# Patient Record
Sex: Male | Born: 1990 | Race: White | Hispanic: No | Marital: Single | Smoking: Never smoker
Health system: Southern US, Community
[De-identification: ages and names within clinical notes are randomized; demographics above are authoritative.]

## PROBLEM LIST (undated history)

## (undated) HISTORY — PX: SHOULDER SURGERY: SHX246

---

## 2015-09-09 ENCOUNTER — Encounter (HOSPITAL_COMMUNITY): Payer: Self-pay

## 2015-09-09 ENCOUNTER — Emergency Department (HOSPITAL_COMMUNITY)
Admission: EM | Admit: 2015-09-09 | Discharge: 2015-09-09 | Disposition: A | Discharge status: Home / Self Care | Payer: Worker's Compensation | Attending: Emergency Medicine | Admitting: Emergency Medicine

## 2015-09-09 ENCOUNTER — Emergency Department (HOSPITAL_COMMUNITY): Payer: Worker's Compensation

## 2015-09-09 DIAGNOSIS — S4992XA Unspecified injury of left shoulder and upper arm, initial encounter: Secondary | ICD-10-CM | POA: Diagnosis present

## 2015-09-09 DIAGNOSIS — S43015A Anterior dislocation of left humerus, initial encounter: Secondary | ICD-10-CM | POA: Diagnosis not present

## 2015-09-09 DIAGNOSIS — Y990 Civilian activity done for income or pay: Secondary | ICD-10-CM | POA: Insufficient documentation

## 2015-09-09 DIAGNOSIS — S43005A Unspecified dislocation of left shoulder joint, initial encounter: Secondary | ICD-10-CM

## 2015-09-09 DIAGNOSIS — Y9289 Other specified places as the place of occurrence of the external cause: Secondary | ICD-10-CM | POA: Insufficient documentation

## 2015-09-09 DIAGNOSIS — Y9389 Activity, other specified: Secondary | ICD-10-CM | POA: Diagnosis not present

## 2015-09-09 DIAGNOSIS — W010XXA Fall on same level from slipping, tripping and stumbling without subsequent striking against object, initial encounter: Secondary | ICD-10-CM | POA: Diagnosis not present

## 2015-09-09 MED ORDER — PROPOFOL 10 MG/ML IV BOLUS
INTRAVENOUS | Status: AC | PRN
  Administered 2015-09-09: 40 mg via INTRAVENOUS

## 2015-09-09 MED ORDER — PROPOFOL 10 MG/ML IV BOLUS: 0.5000 mg/kg | Freq: Once | INTRAVENOUS | Status: DC

## 2015-09-09 MED ORDER — HYDROMORPHONE HCL 1 MG/ML IJ SOLN
0.5000 mg | Freq: Once | INTRAMUSCULAR | Status: AC
  Administered 2015-09-09: 0.5 mg via INTRAVENOUS
  Filled 2015-09-09: qty 1

## 2015-09-09 MED ORDER — DIAZEPAM 5 MG/ML IJ SOLN
5.0000 mg | Freq: Once | INTRAMUSCULAR | Status: AC
  Administered 2015-09-09: 5 mg via INTRAVENOUS
  Filled 2015-09-09: qty 2

## 2015-09-09 MED ORDER — PROPOFOL 10 MG/ML IV BOLUS
0.5000 mg/kg | Freq: Once | INTRAVENOUS | Status: AC
  Administered 2015-09-09: 170 mg via INTRAVENOUS
  Filled 2015-09-09: qty 1

## 2015-09-09 NOTE — ED Notes (Signed)
Per EMS, Pt, from job site, c/o L shoulder injury/dislocation after falling on a roof and trying to catch himself w/ L arm only.  Pain score 10/10.  Deformity noted.  Strong pulses noted.  Fentanyl given en route.

## 2015-09-09 NOTE — ED Notes (Signed)
Bed: ZO10 Expected date:  Expected time:  Means of arrival:  Comments: EMS- shoulder dislocation

## 2015-09-09 NOTE — Discharge Instructions (Signed)
Shoulder Dislocation You dislocated your shoulder today and it has been put back in place.  Do not do any strenuous exercise, heavy lifting until cleared by an orthopedic doctor.  Keep your arm in the arm sling provided.  A shoulder dislocation happens when the upper arm bone (humerus) moves out of the shoulder joint. The shoulder joint is the part of the shoulder where the humerus, shoulder blade (scapula), and collarbone (clavicle) meet. CAUSES This condition is often caused by:  A fall.  A hit to the shoulder.  A forceful movement of the shoulder. RISK FACTORS This condition is more likely to develop in people who play sports. SYMPTOMS Symptoms of this condition include:  Deformity of the shoulder.  Intense pain.  Inability to move the shoulder.  Numbness, weakness, or tingling in your neck or down your arm.  Bruising or swelling around your shoulder. DIAGNOSIS This condition is diagnosed with a physical exam. After the exam, tests may be done to check for related problems. Tests that may be done include:  X-ray. This may be done to check for broken bones.  MRI. This may be done to check for damage to the tissues around the shoulder.  Electromyogram. This may be done to check for nerve damage. TREATMENT This condition is treated with a procedure to place the humerus back in the joint. This procedure is called a reduction. There are two types of reduction:  Closed reduction. In this procedure, the humerus is placed back in the joint without surgery. The health care provider uses his or her hands to guide the bone back into place.  Open reduction. In this procedure, the humerus is placed back in the joint with surgery. An open reduction may be recommended if:  You have a weak shoulder joint or weak ligaments.  You have had more than one shoulder dislocation.  The nerves or blood vessels around your shoulder have been damaged. After the humerus is placed back into the  joint, your arm will be placed in a splint or sling to prevent it from moving. You will need to wear the splint or sling until your shoulder heals. When the splint or sling is removed, you may have physical therapy to help improve the range of motion in your shoulder joint. HOME CARE INSTRUCTIONS If You Have a Splint or Sling:  Wear it as told by your health care provider. Remove it only as told by your health care provider.  Loosen it if your fingers become numb and tingle, or if they turn cold and blue.  Keep it clean and dry. Bathing  Do not take baths, swim, or use a hot tub until your health care provider approves. Ask your health care provider if you can take showers. You may only be allowed to take sponge baths for bathing.  If your health care provider approves bathing and showering, cover your splint or sling with a watertight plastic bag to protect it from water. Do not let the splint or sling get wet. Managing Pain, Stiffness, and Swelling  If directed, apply ice to the injured area.  Put ice in a plastic bag.  Place a towel between your skin and the bag.  Leave the ice on for 20 minutes, 2-3 times per day.  Move your fingers often to avoid stiffness and to decrease swelling.  Raise (elevate) the injured area above the level of your heart while you are sitting or lying down. Driving  Do not drive while wearing a splint  or sling on a hand that you use for driving.  Do not drive or operate heavy machinery while taking pain medicine. Activity  Return to your normal activities as told by your health care provider. Ask your health care provider what activities are safe for you.  Perform range-of-motion exercises only as told by your health care provider.  Exercise your hand by squeezing a soft ball. This helps to decrease stiffness and swelling in your hand and wrist. General Instructions  Take over-the-counter and prescription medicines only as told by your health care  provider.  Do not use any tobacco products, including cigarettes, chewing tobacco, or e-cigarettes. Tobacco can delay bone and tissue healing. If you need help quitting, ask your health care provider.  Keep all follow-up visits as told by your health care provider. This is important. SEEK MEDICAL CARE IF:  Your splint or sling gets damaged. SEEK IMMEDIATE MEDICAL CARE IF:  Your pain gets worse rather than better.  You lose feeling in your arm or hand.  Your arm or hand becomes white and cold.   This information is not intended to replace advice given to you by your health care provider. Make sure you discuss any questions you have with your health care provider.   Document Released: 08/15/2001 Document Revised: 08/11/2015 Document Reviewed: 03/15/2015 Elsevier Interactive Patient Education 2016 [employer].   How to Use a Shoulder Immobilizer A shoulder immobilizer is a device that you may have to wear after a shoulder injury or surgery. This device keeps your arm from moving. This prevents additional pain or injury. It also supports your arm next to your body as your shoulder heals. You may need to wear a shoulder immobilizer to treat a broken bone (fracture) in your shoulder. You may also need to wear one if you have an injury that moves your shoulder out of position (dislocation). There are different types of shoulder immobilizers. The one that you get depends on your injury. RISKS AND COMPLICATIONS Wearing a shoulder immobilizer in the wrong way can let your injured shoulder move around too much. This may delay healing and make your pain and swelling worse. HOW TO USE YOUR SHOULDER IMMOBILIZER  The part of the immobilizer that goes around your neck (sling) should support your upper arm, with your elbow bent and your lower arm and hand across your chest.  Make sure that your elbow:  Is snug against the back pocket of the sling.  Does not move away from your body.  The strap  of the immobilizer should go over your shoulder and support your arm and hand. Your hand should be slightly higher than your elbow. It should not hang loosely over the edge of the sling.  If the long strap has a pad, place it where it is most comfortable on your neck.  Carefully follow your health care provider's instructions for wearing your shoulder immobilizer. Your health care provider may want you to:  Loosen your immobilizer to straighten your elbow and move your wrist and fingers. You may have to do this several times each day. Ask your health care provider when you should do this and how often.  Remove your immobilizer once every day to shower, but limit the movement in your injured arm. Before putting the immobilizer back on, use a towel to dry the area under your arm completely.  Remove your immobilizer to do shoulder exercises at home as directed by your health care provider.  Wear your immobilizer while you  sleep. You may sleep more comfortably if you have your upper body raised on pillows. SEEK MEDICAL CARE IF:  Your immobilizer is not supporting your arm properly.  Your immobilizer gets damaged.  You have worsening pain or swelling in your shoulder, arm, or hand.  Your shoulder, arm, or hand changes color or temperature.  You lose feeling in your shoulder, arm, or hand.   This information is not intended to replace advice given to you by your health care provider. Make sure you discuss any questions you have with your health care provider.   Document Released: 12/28/2004 Document Revised: 04/06/2015 Document Reviewed: 10/28/2014 Elsevier Interactive Patient Education [employer]

## 2015-09-09 NOTE — ED Provider Notes (Signed)
CSN: [id number]     Arrival date & time 09/09/15  1057 History   First MD Initiated Contact with Patient 09/09/15 1104     Chief Complaint  Patient presents with  . Shoulder Injury     (Consider location/radiation/quality/duration/timing/severity/associated sxs/prior Treatment) HPI Comments: 24 y.o. Male with no significant past medical history presents for left shoulder injury.  The patient works in roofing and the patient was on a roof when he tried to run up part of the roof to the next part and started slipping and grabbed hold of part of the roof with his left hand and felt his left shoulder come out of place.  He denies previous injury to this shoulder.  He did not fall.  No other injury.  Patient is a 24 y.o. male presenting with shoulder injury.  Shoulder Injury Pertinent negatives include no chest pain, no abdominal pain, no headaches and no shortness of breath.    No past medical history on file. No past surgical history on file. No family history on file. Social History  Substance Use Topics  . Smoking status: Not on file  . Smokeless tobacco: Not on file  . Alcohol Use: Not on file    Review of Systems  Constitutional: Negative for fever, chills and fatigue.  HENT: Negative for congestion, postnasal drip and rhinorrhea.   Respiratory: Negative for cough, chest tightness and shortness of breath.   Cardiovascular: Negative for chest pain, palpitations and leg swelling.  Gastrointestinal: Negative for nausea, vomiting and abdominal pain.  Genitourinary: Negative for dysuria and flank pain.  Musculoskeletal: Positive for arthralgias (left shoulder pain). Negative for myalgias, joint swelling and neck pain.  Neurological: Negative for syncope, weakness, numbness and headaches.  Hematological: Does not bruise/bleed easily.      Allergies  Review of patient's allergies indicates not on file.  Home Medications   Prior to Admission medications   Not on File   BP  150/101 mmHg  Pulse 91  Temp(Src) 98 F (36.7 C) (Oral)  Resp 20  SpO2 99% Physical Exam  Constitutional: He is oriented to person, place, and time. He appears well-developed and well-nourished. No distress.  HENT:  Head: Normocephalic and atraumatic.  Right Ear: External ear normal.  Left Ear: External ear normal.  Mouth/Throat: Oropharynx is clear and moist. No oropharyngeal exudate.  Eyes: EOM are normal. Pupils are equal, round, and reactive to light.  Neck: Normal range of motion. Neck supple.  Cardiovascular: Normal rate, regular rhythm, normal heart sounds and intact distal pulses.   No murmur heard. Pulmonary/Chest: Effort normal. No respiratory distress. He has no wheezes. He has no rales.  Abdominal: Soft. He exhibits no distension. There is no tenderness.  Musculoskeletal: He exhibits no edema.       Right shoulder: Normal.       Left shoulder: He exhibits decreased range of motion, tenderness, deformity, pain and spasm. He exhibits no laceration, normal pulse and normal strength.       Left elbow: Normal.       Left hand: Normal. Normal sensation noted. Normal strength noted.  Neurological: He is alert and oriented to person, place, and time.  Skin: Skin is warm and dry. No rash noted. He is not diaphoretic.  Vitals reviewed.   ED Course  Reduction of dislocation Date/Time: 09/09/2015 12:59 PM Performed by: Tyrone Apple ROE Authorized by: Leta Baptist Consent: Verbal consent obtained. Written consent not obtained. Risks and benefits: risks, benefits and alternatives were discussed Consent  given by: patient Patient understanding: patient states understanding of the procedure being performed Patient consent: the patient's understanding of the procedure matches consent given Imaging studies: imaging studies available Required items: required blood products, implants, devices, and special equipment available Patient identity confirmed: verbally with patient and  arm band Local anesthesia used: no Patient sedated: yes Sedation type: moderate (conscious) sedation Sedatives: propofol Vitals: Vital signs were monitored during sedation. Patient tolerance: Patient tolerated the procedure well with no immediate complications Comments: Left shoulder reduced at bedside without complication  Procedural sedation Date/Time: 09/09/2015 1:00 PM Performed by: Tyrone Apple ROE Authorized by: Leta Baptist Consent: Verbal consent obtained. Risks and benefits: risks, benefits and alternatives were discussed Consent given by: patient Patient understanding: patient states understanding of the procedure being performed Patient consent: the patient's understanding of the procedure matches consent given Imaging studies: imaging studies available Required items: required blood products, implants, devices, and special equipment available Patient identity confirmed: verbally with patient and arm band Local anesthesia used: no Patient sedated: yes Sedatives: propofol Vitals: Vital signs were monitored during sedation. Patient tolerance: Patient tolerated the procedure well with no immediate complications   (including critical care time) Labs Review Labs Reviewed - No data to display  Imaging Review No results found. I have personally reviewed and evaluated these images and lab results as part of my medical decision-making.   EKG Interpretation None      MDM  Patient seen and evaluated in stable condition.  Neurovascularly intact.  Xray confirmed left shoulder dislocation without fracture.  Patient sedated and left shoulder reduced without complication.  Patient placed in shoulder immobilizer.  Patient instructed to follow up with orthopedics, refrain from strenuous physical activity/heavy lifting, and to keep immobilizer in place.  Patient discharge in stable condition with all questions answered prior to discharge. Final diagnoses:  None    1. Left  shoulder dislocation    Leta Baptist, MD 09/10/15 2143

## 2017-05-12 IMAGING — CR DG SHOULDER 2+V*L*
3 series · 3 of 3 positions shown · non-contrast
Comparison: None.

CLINICAL DATA: Injured shoulder at work today.

EXAM:
LEFT SHOULDER - 2+ VIEW

[x shoulder ap left (1 of 3)]
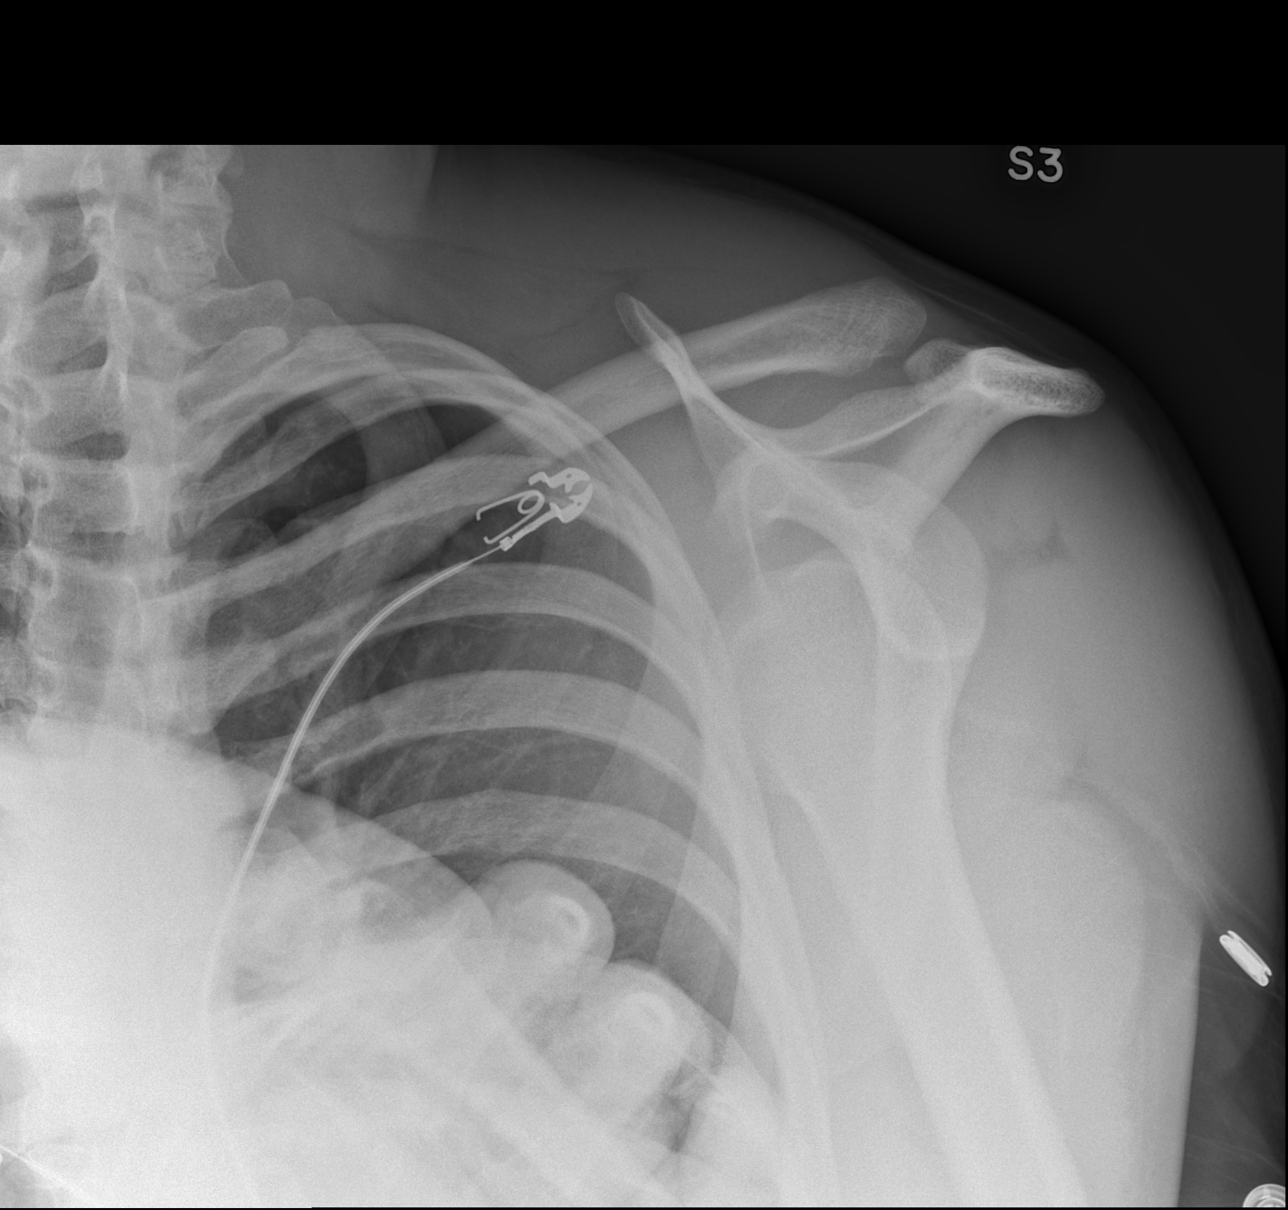

[x shoulder ap left (2 of 3)]
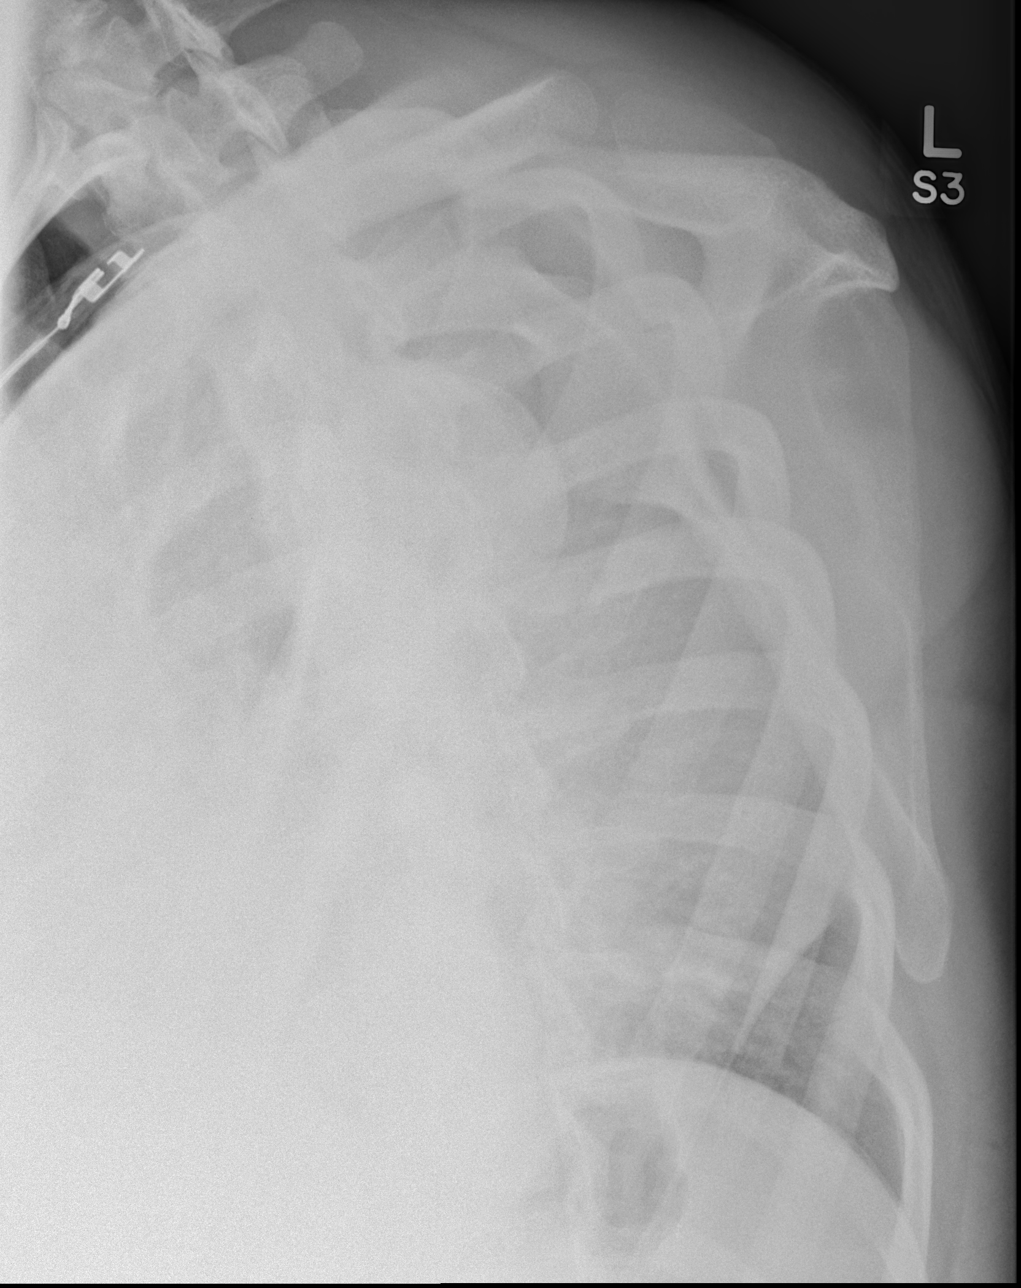

[x shoulder ap left (3 of 3)]
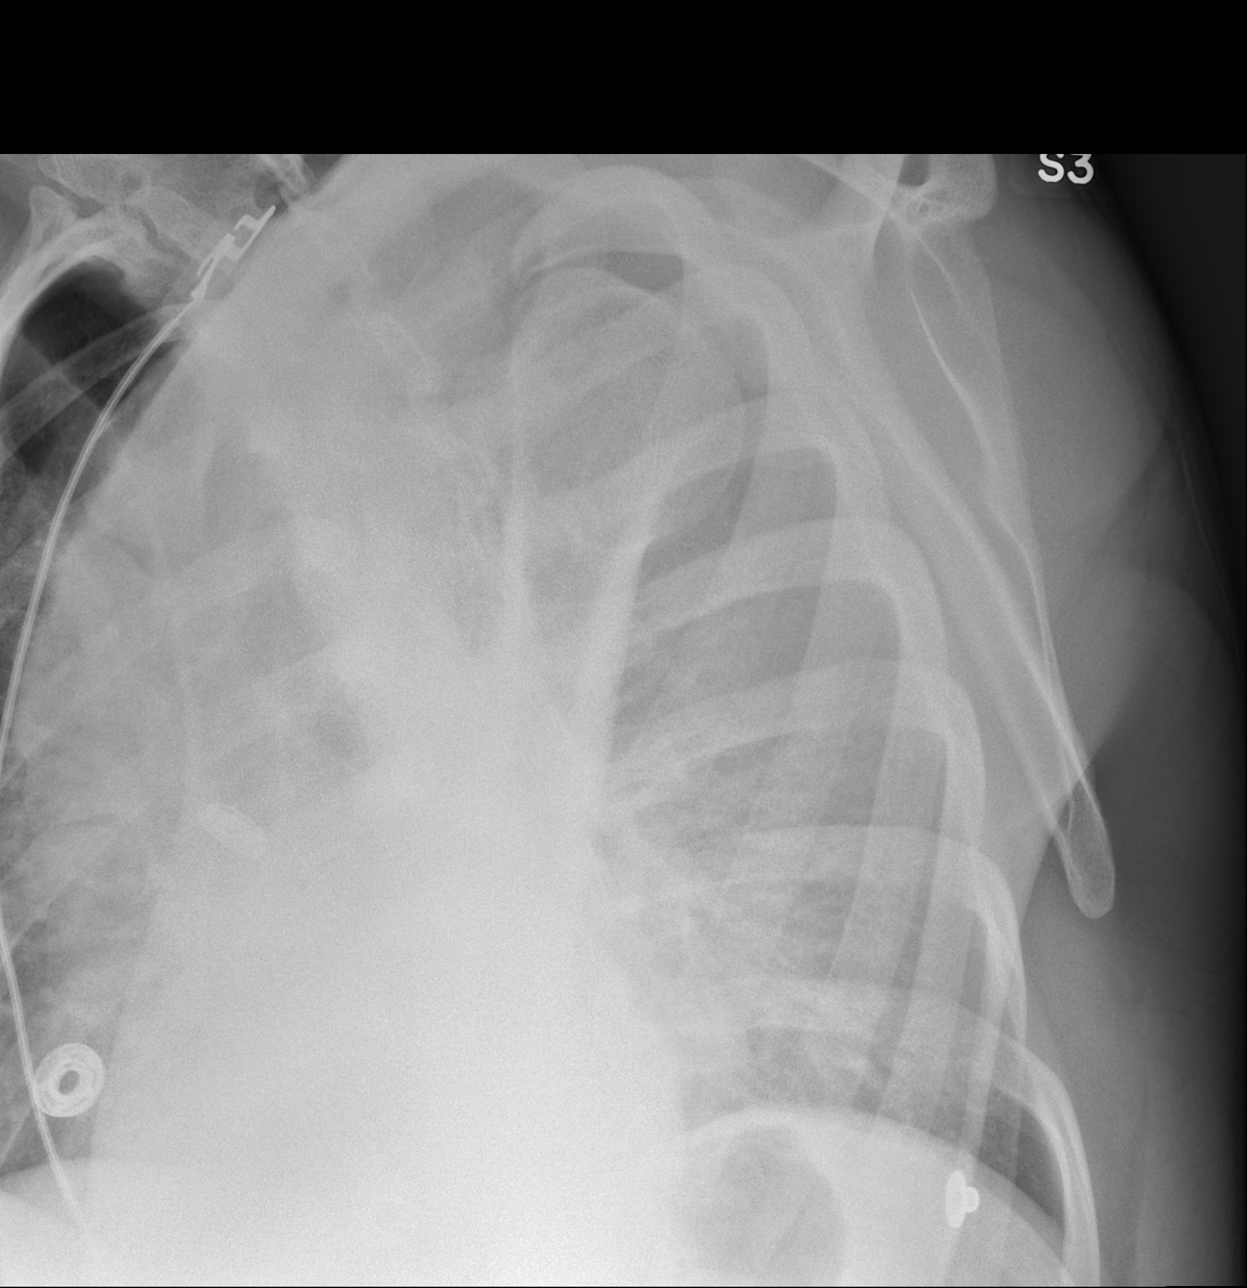

[3 of 3 positions shown; findings below may reference images not displayed]

FINDINGS: Anterior subcoracoid dislocation of the humeral head. No obvious
fracture. The left ribs are intact. The AC joint is intact.
IMPRESSION: Anterior subcoracoid dislocation of the humeral head without
definite fracture.

## 2017-07-05 ENCOUNTER — Emergency Department
Admission: EM | Admit: 2017-07-05 | Discharge: 2017-07-05 | Disposition: A | Discharge status: Home / Self Care | Payer: BLUE CROSS/BLUE SHIELD | Source: Home / Self Care | Attending: Family Medicine | Admitting: Family Medicine

## 2017-07-05 ENCOUNTER — Other Ambulatory Visit: Payer: Self-pay | Admitting: Emergency Medicine

## 2017-07-05 DIAGNOSIS — R197 Diarrhea, unspecified: Secondary | ICD-10-CM | POA: Diagnosis not present

## 2017-07-05 DIAGNOSIS — R110 Nausea: Secondary | ICD-10-CM

## 2017-07-05 MED ORDER — ONDANSETRON HCL 4 MG PO TABS: 4.0000 mg | ORAL_TABLET | Freq: Four times a day (QID) | ORAL | 0 refills | Status: AC

## 2017-07-05 NOTE — Discharge Instructions (Signed)
°  You may try over the counter pepto-bismol or imodium to help with diarrhea if having more than one stool every hour.  Do not take more than recommended dose on product packaging.  If you still have 10 episodes of diarrhea in 24 hours, develop mucous or blood in stool, please call our office and an antibiotic may be called in for you.    Try to stay well hydrated by eating ice chips, popsicles, and sipping on sports drinks to replenish electrolytes to help prevent you from feeling dizzy.  Take small slow sips to help prevent worsening stomach upset.

## 2017-07-05 NOTE — ED Provider Notes (Signed)
CSN: [id number]     Arrival date & time 07/05/17  [id number] History   First MD Initiated Contact with Patient 07/05/17 [phone]     Chief Complaint  Patient presents with  . Abdominal Pain  . Diarrhea  . Nausea   (Consider location/radiation/quality/duration/timing/severity/associated sxs/prior Treatment) HPI Ree Edmanicholas Clodfelter is a 26 y.o. male presenting to UC with c/o loss of appetite Tuesday evening after eating [employer] for lunch.  Yesterday morning he woke with generalized abdominal cramping with nausea.  He has had about 10 episodes of watery diarrhea in the last 24 hours. No blood or mucous in the stool. No sick contacts or recent travel.  Denies fever or vomiting. He did take Dayquil and Nyquil 2 days ago due to felling run down but denies cough or congestion.   History reviewed. No pertinent past medical history. Past Surgical History:  Procedure Laterality Date  . SHOULDER SURGERY     dislocated in 2016   History reviewed. No pertinent family history. Social History  Substance Use Topics  . Smoking status: Never Smoker  . Smokeless tobacco: Never Used  . Alcohol use Yes    Review of Systems  Constitutional: Positive for chills. Negative for fever.  HENT: Negative for congestion, ear pain, sore throat, trouble swallowing and voice change.   Respiratory: Negative for cough and shortness of breath.   Cardiovascular: Negative for chest pain and palpitations.  Gastrointestinal: Positive for abdominal pain (generalized cramping), diarrhea and nausea. Negative for vomiting.  Musculoskeletal: Negative for arthralgias, back pain and myalgias.  Skin: Negative for rash.    Allergies  Patient has no known allergies.  Home Medications   Prior to Admission medications   Medication Sig Start Date End Date Taking? Authorizing Provider  ondansetron (ZOFRAN) 4 MG tablet Take 1 tablet (4 mg total) by mouth every 6 (six) hours. 07/05/17   Lurene ShadowPhelps, Kendria Halberg O, PA-C   Meds Ordered and Administered  this Visit  Medications - No data to display  BP 118/81 (BP Location: Left Arm)   Pulse 95   Temp 98.3 F (36.8 C) (Oral)   Ht 5\' 10"  (1.778 m)   Wt 175 lb (79.4 kg)   SpO2 97%   BMI 25.11 kg/m  No data found.   Physical Exam  Constitutional: He is oriented to person, place, and time. He appears well-developed and well-nourished. No distress.  HENT:  Head: Normocephalic and atraumatic.  Mouth/Throat: Oropharynx is clear and moist.  Eyes: EOM are normal.  Neck: Normal range of motion. Neck supple.  Cardiovascular: Normal rate and regular rhythm.   Pulmonary/Chest: Effort normal and breath sounds normal. No respiratory distress. He has no wheezes. He has no rales.  Abdominal: Soft. He exhibits no distension and no mass. Bowel sounds are increased. There is no tenderness. There is no rebound and no guarding.  Musculoskeletal: Normal range of motion.  Neurological: He is alert and oriented to person, place, and time.  Skin: Skin is warm and dry. He is not diaphoretic.  Psychiatric: He has a normal mood and affect. His behavior is normal.  Nursing note and vitals reviewed.   Urgent Care Course     Procedures (including critical care time)  Labs Review Labs Reviewed  GASTROINTESTINAL PANEL BY PCR, STOOL (REPLACES STOOL CULTURE)    Imaging Review No results found.    MDM   1. Diarrhea of presumed infectious origin   2. Nausea    Pt c/o diarrhea and nausea since yesterday after eating [employer] 2  days ago.    Stool sample given in UC to send off for PCR testing.  Rx: Zofran May try OTC Pepto-Bismol or Imodium as detailed by product packaging, however, if he continues to have more than 10 stools in 24 hours, develops fever, or blood or mucous in stool, encouraged to call UC to start on empiric antibiotics Encouraged to stay well hydrated. Discussed bland diet.  Home care instructions provided.    Lurene Shadowhelps, Teeghan Hammer O, [place]PA-C 07/05/17 1130

## 2017-07-05 NOTE — ED Triage Notes (Signed)
Pt lost his appetite Tuesday evening after eating [employer] for lunch.  Started with abdominal pain yesterday morning, denies vomiting, but has had nausea.  Has had diarrhea since yesterday morning.

## 2017-07-09 ENCOUNTER — Telehealth: Payer: Self-pay

## 2017-07-09 LAB — GASTROINTESTINAL PATHOGEN PANEL PCR
C. difficile Tox A/B, PCR: NOT DETECTED
Campylobacter, PCR: NOT DETECTED
Cryptosporidium, PCR: NOT DETECTED
E coli (ETEC) LT/ST PCR: NOT DETECTED
E coli (STEC) stx1/stx2, PCR: NOT DETECTED
E coli 0157, PCR: NOT DETECTED
Giardia lamblia, PCR: NOT DETECTED
Norovirus, PCR: NOT DETECTED
Rotavirus A, PCR: NOT DETECTED
Salmonella, PCR: DETECTED — CR
Shigella, PCR: NOT DETECTED

## 2017-07-09 MED ORDER — CIPROFLOXACIN HCL 500 MG PO TABS: 500.0000 mg | ORAL_TABLET | Freq: Two times a day (BID) | ORAL | 0 refills | Status: AC

## 2017-07-09 NOTE — Telephone Encounter (Signed)
Left VM per E. Rolley SimsPhelps PA-C, to be aware that Cipro can cause muscle pain and cramps.  If these occur to call the UC.

## 2017-07-09 NOTE — Telephone Encounter (Signed)
Cipro sent to [facility] on file. F/u with PCP as needed if not improving in 3-4 days.  Discontinue antibiotic if developing joint or muscle pain, rash, or other concerning symptoms.

## 2017-07-09 NOTE — Telephone Encounter (Signed)
Pt notified of stool results.  Is feeling better, but still having diarrhea at times.  Dario GuardianE. Phelps, PA-C notified and will send in medication to [facility].
